# Patient Record
Sex: Female | Born: 2000 | Race: White | Hispanic: Yes | Marital: Single | Smoking: Never smoker
Health system: Southern US, Community
[De-identification: ages and names within clinical notes are randomized; demographics above are authoritative.]

---
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
## 2021-09-27 ENCOUNTER — Emergency Department (HOSPITAL_COMMUNITY)
Admission: EM | Admit: 2021-09-27 | Discharge: 2021-09-28 | Disposition: A | Discharge status: Home / Self Care | Payer: Medicaid Other | Attending: Emergency Medicine | Admitting: Emergency Medicine

## 2021-09-27 ENCOUNTER — Encounter (HOSPITAL_COMMUNITY): Payer: Self-pay

## 2021-09-27 DIAGNOSIS — L509 Urticaria, unspecified: Secondary | ICD-10-CM | POA: Insufficient documentation

## 2021-09-27 MED ORDER — PREDNISONE 20 MG PO TABS
60.0000 mg | ORAL_TABLET | Freq: Once | ORAL | Status: AC
  Administered 2021-09-28: 60 mg via ORAL
  Filled 2021-09-27: qty 3

## 2021-09-27 MED ORDER — FAMOTIDINE 20 MG PO TABS
20.0000 mg | ORAL_TABLET | Freq: Once | ORAL | Status: AC
  Administered 2021-09-28: 20 mg via ORAL
  Filled 2021-09-27: qty 1

## 2021-09-27 MED ORDER — DIPHENHYDRAMINE HCL 25 MG PO CAPS
25.0000 mg | ORAL_CAPSULE | Freq: Once | ORAL | Status: AC
  Administered 2021-09-28: 25 mg via ORAL
  Filled 2021-09-27: qty 1

## 2021-09-27 NOTE — ED Triage Notes (Signed)
Pt states that she broke out in hives several hours ago, denies SOB, denies new foods or soaps. No meds PTA

## 2021-09-27 NOTE — ED Provider Notes (Signed)
[facility] EMERGENCY DEPARTMENT Provider Note   CSN: [id number] Arrival date & time: 09/27/21  2225     History  Chief Complaint  Patient presents with   Urticaria    Laurie Adams is a 21 y.o. female.  The history is provided by the patient and medical records.  Urticaria   21 year old female presenting to the ED with hives.  States began a few hours ago, unclear etiology.  She denies any changes in soaps, detergents, medications, or new foods.  She has no known allergies.  States she feels very itchy.  No intervention tried prior to arrival.  Home Medications Prior to Admission medications   Medication Sig Start Date End Date Taking? Authorizing Provider  diphenhydrAMINE (BENADRYL) 25 MG tablet Take 1 tablet (25 mg total) by mouth every 6 (six) hours as needed. 09/28/21  Yes Garlon Hatchet, PA-C  famotidine (PEPCID) 20 MG tablet Take 1 tablet (20 mg total) by mouth daily. 09/28/21  Yes Garlon Hatchet, PA-C  predniSONE (DELTASONE) 20 MG tablet Take 40 mg by mouth daily for 3 days, then 20mg  by mouth daily for 3 days, then 10mg  daily for 3 days 09/28/21  Yes , PA-C  Cetirizine HCl 10 MG CAPS Take 1 capsule (10 mg total) by mouth every morning. 03/26/15 04/29/15  03/28/15, DO  fluticasone (FLONASE) 50 MCG/ACT nasal spray Place 2 sprays into both nostrils daily. 03/26/15 04/29/15  03/28/15, DO      Allergies    Patient has no known allergies.    Review of Systems   Review of Systems  Skin:  Positive for rash.  All other systems reviewed and are negative.   Physical Exam Updated Vital Signs BP (!) 117/98   Pulse 93   Temp 97.7 F (36.5 C) (Oral)   Resp 18   SpO2 100%   Physical Exam Vitals and nursing note reviewed.  Constitutional:      Appearance: She is well-developed.  HENT:     Head: Normocephalic and atraumatic.     Mouth/Throat:     Comments: No lip/tongue swelling, handling secretions well, no stridor Eyes:      Conjunctiva/sclera: Conjunctivae normal.     Pupils: Pupils are equal, round, and reactive to light.  Cardiovascular:     Rate and Rhythm: Normal rate and regular rhythm.     Heart sounds: Normal heart sounds.  Pulmonary:     Effort: Pulmonary effort is normal. No respiratory distress.     Breath sounds: Normal breath sounds. No rhonchi.  Abdominal:     General: Bowel sounds are normal.     Palpations: Abdomen is soft.     Tenderness: There is no abdominal tenderness. There is no rebound.  Musculoskeletal:        General: Normal range of motion.     Cervical back: Normal range of motion.  Skin:    General: Skin is warm and dry.     Findings: Rash present. Rash is urticarial.     Comments: Urticarial rash concentrated along UE and torso, sparse on LE, spares palms/soles, no signs of superimposed infection or cellulitis  Neurological:     Mental Status: She is alert and oriented to person, place, and time.     ED Results / Procedures / Treatments   Labs (all labs ordered are listed, but only abnormal results are displayed) Labs Reviewed - No data to display  EKG None  Radiology No results found.  Procedures  Procedures    Medications Ordered in ED Medications  predniSONE (DELTASONE) tablet 60 mg (60 mg Oral Given 09/28/21 0003)  diphenhydrAMINE (BENADRYL) capsule 25 mg (25 mg Oral Given 09/28/21 0003)  famotidine (PEPCID) tablet 20 mg (20 mg Oral Given 09/28/21 0003)    ED Course/ Medical Decision Making/ A&P                           Medical Decision Making Risk OTC drugs. Prescription drug management.   21 year old female here with urticaria.  Unclear etiology, ongoing for few hours now.  Afebrile, nontoxic.  She has no lip or tongue swelling, handling secretions well, no stridor.  Mild urticarial rash, concentrated along upper extremities and torso, sparingly on lower extremities.  Rash spares palms and soles.  No signs of superimposed infection or cellulitis.   Treated here with prednisone, Benadryl, Pepcid, plan to discharge home with same.  Does not currently have a PCP, given follow-up with wellness clinic.  Can return here for new concerns.  Final Clinical Impression(s) / ED Diagnoses Final diagnoses:  Urticaria    Rx / DC Orders ED Discharge Orders          Ordered    predniSONE (DELTASONE) 20 MG tablet        09/28/21 0005    famotidine (PEPCID) 20 MG tablet  Daily        09/28/21 0005    diphenhydrAMINE (BENADRYL) 25 MG tablet  Every 6 hours PRN        09/28/21 0005              Garlon Hatchet, PA-C 09/28/21 0011    Sabas Sous, MD 09/28/21 [phone]

## 2021-09-28 MED ORDER — FAMOTIDINE 20 MG PO TABS: 20.0000 mg | ORAL_TABLET | Freq: Every day | ORAL | 0 refills | Status: DC

## 2021-09-28 MED ORDER — DIPHENHYDRAMINE HCL 25 MG PO TABS: 25.0000 mg | ORAL_TABLET | Freq: Four times a day (QID) | ORAL | 0 refills | Status: AC | PRN

## 2021-09-28 MED ORDER — PREDNISONE 20 MG PO TABS: ORAL_TABLET | ORAL | 0 refills | Status: DC

## 2021-09-28 NOTE — Discharge Instructions (Addendum)
Take the prescribed medication as directed. Follow-up with wellness clinic.   Call for appt. Return to the ED for new or worsening symptoms.

## 2021-09-28 NOTE — ED Notes (Signed)
Patient verbalizes understanding of discharge instructions. Opportunity for questioning and answers were provided. Armband removed by staff, pt discharged from ED. Pt ambulatory to ED waiting room. 

## 2022-12-09 LAB — OB RESULTS CONSOLE GC/CHLAMYDIA: Chlamydia: POSITIVE

## 2023-01-06 LAB — OB RESULTS CONSOLE RUBELLA ANTIBODY, IGM: Rubella: IMMUNE

## 2023-01-06 LAB — OB RESULTS CONSOLE RPR: RPR: NONREACTIVE

## 2023-01-06 LAB — OB RESULTS CONSOLE HIV ANTIBODY (ROUTINE TESTING): HIV: NONREACTIVE

## 2023-01-06 LAB — OB RESULTS CONSOLE ANTIBODY SCREEN: Antibody Screen: NEGATIVE

## 2023-01-06 LAB — HEPATITIS C ANTIBODY: HCV Ab: NEGATIVE

## 2023-01-06 LAB — OB RESULTS CONSOLE HEPATITIS B SURFACE ANTIGEN: Hepatitis B Surface Ag: NEGATIVE

## 2023-01-06 LAB — OB RESULTS CONSOLE GC/CHLAMYDIA
Chlamydia: NEGATIVE
Neisseria Gonorrhea: NEGATIVE

## 2023-03-30 NOTE — L&D Delivery Note (Signed)
 Delivery Note Patient pushed well for 1 hour.  At 5:13 PM a viable female was delivered via Vaginal, Spontaneous (Presentation: Left Occiput Anterior).  APGAR: 8, 9; weight 8 lb 8.2 oz (3860 gm) .   Placenta status: Spontaneous, Intact.  Cord: 3 vessels with the following complications: None.  Cord pH: n/a  Anesthesia: Epidural Episiotomy:  No Lacerations: 2nd degree;Perineal Suture Repair: 2.0 vicryl rapide Est. Blood Loss (mL):  431 mL  Mom to postpartum.  Baby to Couplet care / Skin to Skin.  [facility] GEFFEL Neasia Fleeman 07/18/2023, 5:36 PM

## 2023-07-01 LAB — OB RESULTS CONSOLE GBS: GBS: NEGATIVE

## 2023-07-07 ENCOUNTER — Telehealth (HOSPITAL_COMMUNITY): Payer: Self-pay | Admitting: *Deleted

## 2023-07-07 NOTE — Telephone Encounter (Signed)
 Preadmission screen

## 2023-07-12 ENCOUNTER — Telehealth (HOSPITAL_COMMUNITY): Payer: Self-pay | Admitting: *Deleted

## 2023-07-12 NOTE — Telephone Encounter (Signed)
 Preadmission screen

## 2023-07-13 ENCOUNTER — Telehealth (HOSPITAL_COMMUNITY): Payer: Self-pay | Admitting: *Deleted

## 2023-07-13 ENCOUNTER — Encounter (HOSPITAL_COMMUNITY): Payer: Self-pay | Admitting: *Deleted

## 2023-07-13 NOTE — Telephone Encounter (Signed)
 Preadmission screen

## 2023-07-18 ENCOUNTER — Inpatient Hospital Stay (HOSPITAL_COMMUNITY): Admitting: Anesthesiology

## 2023-07-18 ENCOUNTER — Inpatient Hospital Stay (HOSPITAL_COMMUNITY)
Admission: AD | Admit: 2023-07-18 | Discharge: 2023-07-20 | DRG: 806 | Disposition: A | Discharge status: Home / Self Care | Attending: Obstetrics | Admitting: Obstetrics

## 2023-07-18 ENCOUNTER — Encounter (HOSPITAL_COMMUNITY): Payer: Self-pay | Admitting: Obstetrics and Gynecology

## 2023-07-18 ENCOUNTER — Other Ambulatory Visit: Payer: Self-pay

## 2023-07-18 DIAGNOSIS — L299 Pruritus, unspecified: Secondary | ICD-10-CM | POA: Diagnosis not present

## 2023-07-18 DIAGNOSIS — O99214 Obesity complicating childbirth: Secondary | ICD-10-CM | POA: Diagnosis present

## 2023-07-18 DIAGNOSIS — O480 Post-term pregnancy: Principal | ICD-10-CM | POA: Diagnosis present

## 2023-07-18 DIAGNOSIS — O99892 Other specified diseases and conditions complicating childbirth: Secondary | ICD-10-CM | POA: Diagnosis not present

## 2023-07-18 DIAGNOSIS — Z3A40 40 weeks gestation of pregnancy: Secondary | ICD-10-CM | POA: Diagnosis not present

## 2023-07-18 DIAGNOSIS — K219 Gastro-esophageal reflux disease without esophagitis: Secondary | ICD-10-CM | POA: Diagnosis present

## 2023-07-18 DIAGNOSIS — O9962 Diseases of the digestive system complicating childbirth: Secondary | ICD-10-CM | POA: Diagnosis present

## 2023-07-18 DIAGNOSIS — O26893 Other specified pregnancy related conditions, third trimester: Secondary | ICD-10-CM | POA: Diagnosis present

## 2023-07-18 DIAGNOSIS — D62 Acute posthemorrhagic anemia: Secondary | ICD-10-CM | POA: Diagnosis not present

## 2023-07-18 DIAGNOSIS — O9081 Anemia of the puerperium: Secondary | ICD-10-CM | POA: Diagnosis not present

## 2023-07-18 LAB — CBC
HCT: 34 % — ABNORMAL LOW (ref 36.0–46.0)
Hemoglobin: 10.4 g/dL — ABNORMAL LOW (ref 12.0–15.0)
MCH: 24.2 pg — ABNORMAL LOW (ref 26.0–34.0)
MCHC: 30.6 g/dL (ref 30.0–36.0)
MCV: 79.3 fL — ABNORMAL LOW (ref 80.0–100.0)
Platelets: 190 10*3/uL (ref 150–400)
RBC: 4.29 MIL/uL (ref 3.87–5.11)
RDW: 17.3 % — ABNORMAL HIGH (ref 11.5–15.5)
WBC: 12.1 10*3/uL — ABNORMAL HIGH (ref 4.0–10.5)
nRBC: 0 % (ref 0.0–0.2)

## 2023-07-18 LAB — RPR: RPR Ser Ql: NONREACTIVE

## 2023-07-18 LAB — TYPE AND SCREEN
ABO/RH(D): O POS
Antibody Screen: NEGATIVE

## 2023-07-18 MED ORDER — LACTATED RINGERS IV SOLN
INTRAVENOUS | Status: DC
Start: 2023-07-18 — End: 2023-07-18

## 2023-07-18 MED ORDER — LACTATED RINGERS IV SOLN: 500.0000 mL | Freq: Once | INTRAVENOUS | Status: DC

## 2023-07-18 MED ORDER — IBUPROFEN 600 MG PO TABS
600.0000 mg | ORAL_TABLET | Freq: Four times a day (QID) | ORAL | Status: DC
  Administered 2023-07-18 – 2023-07-20 (×5): 600 mg via ORAL
  Filled 2023-07-18 (×6): qty 1

## 2023-07-18 MED ORDER — ONDANSETRON HCL 4 MG/2ML IJ SOLN: 4.0000 mg | INTRAMUSCULAR | Status: DC | PRN

## 2023-07-18 MED ORDER — PHENYLEPHRINE 80 MCG/ML (10ML) SYRINGE FOR IV PUSH (FOR BLOOD PRESSURE SUPPORT)
80.0000 ug | PREFILLED_SYRINGE | INTRAVENOUS | Status: DC | PRN
  Filled 2023-07-18: qty 10

## 2023-07-18 MED ORDER — ACETAMINOPHEN 325 MG PO TABS: 650.0000 mg | ORAL_TABLET | ORAL | Status: DC | PRN

## 2023-07-18 MED ORDER — BENZOCAINE-MENTHOL 20-0.5 % EX AERO
1.0000 | INHALATION_SPRAY | CUTANEOUS | Status: DC | PRN
  Filled 2023-07-18: qty 56

## 2023-07-18 MED ORDER — OXYTOCIN BOLUS FROM INFUSION
333.0000 mL | Freq: Once | INTRAVENOUS | Status: AC
  Administered 2023-07-18: 333 mL via INTRAVENOUS

## 2023-07-18 MED ORDER — OXYTOCIN-SODIUM CHLORIDE 30-0.9 UT/500ML-% IV SOLN
1.0000 m[IU]/min | INTRAVENOUS | Status: DC
Start: 2023-07-18 — End: 2023-07-18

## 2023-07-18 MED ORDER — ONDANSETRON HCL 4 MG/2ML IJ SOLN: 4.0000 mg | Freq: Four times a day (QID) | INTRAMUSCULAR | Status: DC | PRN

## 2023-07-18 MED ORDER — FENTANYL-BUPIVACAINE-NACL 0.5-0.125-0.9 MG/250ML-% EP SOLN
12.0000 mL/h | EPIDURAL | Status: DC | PRN
  Administered 2023-07-18: 12 mL/h via EPIDURAL
  Filled 2023-07-18: qty 250

## 2023-07-18 MED ORDER — OXYCODONE-ACETAMINOPHEN 5-325 MG PO TABS: 1.0000 | ORAL_TABLET | ORAL | Status: DC | PRN

## 2023-07-18 MED ORDER — SENNOSIDES-DOCUSATE SODIUM 8.6-50 MG PO TABS
2.0000 | ORAL_TABLET | ORAL | Status: DC
  Administered 2023-07-19 – 2023-07-20 (×2): 2 via ORAL
  Filled 2023-07-18 (×2): qty 2

## 2023-07-18 MED ORDER — SIMETHICONE 80 MG PO CHEW: 80.0000 mg | CHEWABLE_TABLET | ORAL | Status: DC | PRN

## 2023-07-18 MED ORDER — SOD CITRATE-CITRIC ACID 500-334 MG/5ML PO SOLN: 30.0000 mL | ORAL | Status: DC | PRN

## 2023-07-18 MED ORDER — EPHEDRINE 5 MG/ML INJ: 10.0000 mg | INTRAVENOUS | Status: DC | PRN

## 2023-07-18 MED ORDER — OXYCODONE-ACETAMINOPHEN 5-325 MG PO TABS: 2.0000 | ORAL_TABLET | ORAL | Status: DC | PRN

## 2023-07-18 MED ORDER — PHENYLEPHRINE 80 MCG/ML (10ML) SYRINGE FOR IV PUSH (FOR BLOOD PRESSURE SUPPORT): 80.0000 ug | PREFILLED_SYRINGE | INTRAVENOUS | Status: DC | PRN

## 2023-07-18 MED ORDER — DIPHENHYDRAMINE HCL 50 MG/ML IJ SOLN: 12.5000 mg | INTRAMUSCULAR | Status: DC | PRN

## 2023-07-18 MED ORDER — COCONUT OIL OIL: 1.0000 | TOPICAL_OIL | Status: DC | PRN

## 2023-07-18 MED ORDER — OXYTOCIN-SODIUM CHLORIDE 30-0.9 UT/500ML-% IV SOLN
1.0000 m[IU]/min | INTRAVENOUS | Status: DC
  Administered 2023-07-18: 2 m[IU]/min via INTRAVENOUS
  Filled 2023-07-18: qty 500

## 2023-07-18 MED ORDER — DIPHENHYDRAMINE HCL 25 MG PO CAPS: 25.0000 mg | ORAL_CAPSULE | Freq: Four times a day (QID) | ORAL | Status: DC | PRN

## 2023-07-18 MED ORDER — FENTANYL CITRATE (PF) 100 MCG/2ML IJ SOLN
50.0000 ug | INTRAMUSCULAR | Status: DC | PRN
Start: 2023-07-18 — End: 2023-07-18

## 2023-07-18 MED ORDER — ONDANSETRON HCL 4 MG PO TABS: 4.0000 mg | ORAL_TABLET | ORAL | Status: DC | PRN

## 2023-07-18 MED ORDER — TERBUTALINE SULFATE 1 MG/ML IJ SOLN: 0.2500 mg | Freq: Once | INTRAMUSCULAR | Status: DC | PRN

## 2023-07-18 MED ORDER — LACTATED RINGERS IV SOLN: 500.0000 mL | INTRAVENOUS | Status: DC | PRN

## 2023-07-18 MED ORDER — PRENATAL MULTIVITAMIN CH
1.0000 | ORAL_TABLET | Freq: Every day | ORAL | Status: DC
  Administered 2023-07-19: 1 via ORAL
  Filled 2023-07-18: qty 1

## 2023-07-18 MED ORDER — OXYTOCIN-SODIUM CHLORIDE 30-0.9 UT/500ML-% IV SOLN
2.5000 [IU]/h | INTRAVENOUS | Status: DC
  Administered 2023-07-18: 2.5 [IU]/h via INTRAVENOUS

## 2023-07-18 MED ORDER — WITCH HAZEL-GLYCERIN EX PADS: 1.0000 | MEDICATED_PAD | CUTANEOUS | Status: DC | PRN

## 2023-07-18 MED ORDER — OXYCODONE HCL 5 MG PO TABS: 10.0000 mg | ORAL_TABLET | ORAL | Status: DC | PRN

## 2023-07-18 MED ORDER — TETANUS-DIPHTH-ACELL PERTUSSIS 5-2.5-18.5 LF-MCG/0.5 IM SUSY: 0.5000 mL | PREFILLED_SYRINGE | Freq: Once | INTRAMUSCULAR | Status: DC

## 2023-07-18 MED ORDER — LIDOCAINE HCL (PF) 1 % IJ SOLN: 30.0000 mL | INTRAMUSCULAR | Status: DC | PRN

## 2023-07-18 MED ORDER — LIDOCAINE HCL (PF) 1 % IJ SOLN
INTRAMUSCULAR | Status: DC | PRN
  Administered 2023-07-18 (×2): 5 mL via EPIDURAL

## 2023-07-18 MED ORDER — DIBUCAINE (PERIANAL) 1 % EX OINT: 1.0000 | TOPICAL_OINTMENT | CUTANEOUS | Status: DC | PRN

## 2023-07-18 MED ORDER — OXYCODONE HCL 5 MG PO TABS: 5.0000 mg | ORAL_TABLET | ORAL | Status: DC | PRN

## 2023-07-18 NOTE — H&P (Signed)
 23 y.o. G1P0 @ [redacted]w[redacted]d presents with contractions q5-8 minutes and possible LOF.  Per MAU report (though not documented in EPIC) fern was negative.  She was noted to have a small amount of vaginal bleeding, so she was kept for labor augmentation.  Otherwise has good fetal movement.  Pregnancy complicated by:  History of chlamydia in September 2024.  F/u testing in 12/2022 and 06/2023 was negative  History reviewed. No pertinent past medical history. History reviewed. No pertinent surgical history.  OB History  Gravida Para Term Preterm AB Living  1       SAB IAB Ectopic Multiple Live Births          # Outcome Date GA Lbr Len/2nd Weight Sex Type Anes PTL Lv  1 Current             Social History   Socioeconomic History   Marital status: Single    Spouse name: Not on file   Number of children: Not on file   Years of education: Not on file   Highest education level: Not on file  Occupational History   Not on file  Tobacco Use   Smoking status: Never   Smokeless tobacco: Never  Vaping Use   Vaping status: Never Used  Substance and Sexual Activity   Alcohol use: Not Currently   Drug use: Never   Sexual activity: Not Currently  Other Topics Concern   Not on file  Social History Narrative   Not on file   Social Drivers of Health   Financial Resource Strain: Not on file  Food Insecurity: Not on file  Transportation Needs: Not on file  Physical Activity: Not on file  Stress: Not on file  Social Connections: Not on file  Intimate Partner Violence: Not on file   Patient has no known allergies.    Prenatal Transfer Tool  Maternal Diabetes: No Genetic Screening: Normal Maternal Ultrasounds/Referrals: Normal Fetal Ultrasounds or other Referrals:  None Maternal Substance Abuse:  No Significant Maternal Medications:  None Significant Maternal Lab Results: Group B Strep negative Vaccines: flu and tdap  ABO, Rh: --/--/O POS (04/21 0631) Antibody: NEG (04/21 0631) Rubella:  Immune (10/10 0000) RPR: Nonreactive (10/10 0000)  HBsAg: Negative (10/10 0000)  HIV: Non-reactive (10/10 0000)  GBS: Negative/-- (04/04 0000)     Vitals:   07/18/23 0753 07/18/23 0836  BP: 132/78 125/84  Pulse: 87 96  Resp: 16   Temp: 98.1 F (36.7 C)   SpO2:       General:  NAD Abdomen:  soft, gravid, EFW 7.5# Ex:  trace edema SVE:  3/60/-3 per MAU.  On pad, small amount of old heme FHTs:  150s, moderate variability, + accelerations, category 1 Toco:  q5-7 minutes    A/P   23 y.o. G1P0 [redacted]w[redacted]d presents with latent labor and vaginal bleeding at term Admit to L&D Vaginal bleeding: may be early bloody show.  Will monitor closely.  Hemoglobin on admission 10.4 Latent labor: patient amenable to augmentation with pitocin .  Will start and AROM prn GBS negative Anticipate SVD    Karan Inclan GEFFEL Ereka Brau

## 2023-07-18 NOTE — MAU Note (Signed)
 Laurie Adams is a 23 y.o. at [redacted]w[redacted]d here in MAU reporting: ctx that started at 0430. Pt states they are irregular anywhere from 5-8 minutes. Pt states she thinks her water broke around 0430. Pt states when she looked at the fluid it was a small spot of watery blood. Pt states she saw a small spot of blood when she wiped but nothing since. +FM   SVE Wednesday 3cm  Onset of complaint: 0430 Pain score: 4/10 lower back  Vitals:   07/18/23 0529  BP: 127/82  Pulse: 95  Resp: 16  Temp: 98 F (36.7 C)  SpO2: 97%     FHT: 165 Lab orders placed from triage:  mau labor

## 2023-07-18 NOTE — Anesthesia Procedure Notes (Signed)
 Epidural Patient location during procedure: OB Start time: 07/18/2023 12:42 PM End time: 07/18/2023 12:51 PM  Staffing Anesthesiologist: Tura Gaines, MD Performed: anesthesiologist   Preanesthetic Checklist Completed: patient identified, IV checked, site marked, risks and benefits discussed, surgical consent, monitors and equipment checked, pre-op evaluation and timeout performed  Epidural Patient position: sitting Prep: DuraPrep and site prepped and draped Patient monitoring: continuous pulse ox and blood pressure Approach: midline Location: L3-L4 Injection technique: LOR air  Needle:  Needle type: Tuohy  Needle gauge: 17 G Needle length: 9 cm and 9 Needle insertion depth: 7 cm Catheter type: closed end flexible Catheter size: 19 Gauge Catheter at skin depth: 12 cm Test dose: negative and Other  Assessment Events: blood not aspirated, no cerebrospinal fluid, injection not painful, no injection resistance, no paresthesia and negative IV test  Additional Notes  Patient identified. Risks and benefits discussed including failed block, incomplete  Pain control, post dural puncture headache, nerve damage, paralysis, blood pressure Changes, nausea, vomiting, reactions to medications-both toxic and allergic and post Partum back pain. All questions were answered. Patient expressed understanding and wished to proceed. Sterile technique was used throughout procedure. Epidural site was Dressed with sterile barrier dressing. No paresthesias, signs of intravascular injection Or signs of intrathecal spread were encountered. Attempt x 2. Technically difficult due to poor positioning. Patient was more comfortable after the epidural was dosed. Please see RN's note for documentation of vital signs and FHR which are stable. Reason for block:procedure for pain

## 2023-07-18 NOTE — Anesthesia Preprocedure Evaluation (Signed)
 Anesthesia Evaluation  Patient identified by MRN, date of birth, ID band Patient awake    Reviewed: Allergy & Precautions, Patient's Chart, lab work & pertinent test results  Airway Mallampati: II  TM Distance: >3 FB     Dental no notable dental hx. (+) Dental Advisory Given   Pulmonary neg pulmonary ROS   Pulmonary exam normal        Cardiovascular negative cardio ROS Normal cardiovascular exam Rhythm:Regular Rate:Normal     Neuro/Psych negative neurological ROS  negative psych ROS   GI/Hepatic Neg liver ROS,GERD  ,,  Endo/Other  Obesity  Renal/GU negative Renal ROS  negative genitourinary   Musculoskeletal negative musculoskeletal ROS (+)    Abdominal  (+) + obese  Peds  Hematology  (+) Blood dyscrasia, anemia   Anesthesia Other Findings   Reproductive/Obstetrics (+) Pregnancy                              Anesthesia Physical Anesthesia Plan  ASA: 2  Anesthesia Plan: Epidural   Post-op Pain Management:    Induction: Intravenous  PONV Risk Score and Plan: Treatment may vary due to age or medical condition  Airway Management Planned: Natural Airway  Additional Equipment: Fetal Monitoring and None  Intra-op Plan:   Post-operative Plan:   Informed Consent: I have reviewed the patients History and Physical, chart, labs and discussed the procedure including the risks, benefits and alternatives for the proposed anesthesia with the patient or authorized representative who has indicated his/her understanding and acceptance.       Plan Discussed with: Anesthesiologist  Anesthesia Plan Comments:          Anesthesia Quick Evaluation

## 2023-07-18 NOTE — Progress Notes (Signed)
 In to see patient at 68 Per RN, had position change from throne to left lateral resulting in prolonged deceleration with resolution when changed to right lateral position  BP 118/78   Pulse 90   Temp 98.8 F (37.1 C) (Oral)   Resp 16   Ht 5\' 3"  (1.6 m)   Wt 79.5 kg   SpO2 99%   BMI 31.05 kg/m   Toco:q3 minutes EFM: Following above deceleration, now 150 with moderate variability and accelerations SVE: 6-7/90/-1  G1 @ [redacted]w[redacted]d with labor Labor: now in active labor.  Continue pitocin  augmentation.  Anticipate SVD

## 2023-07-18 NOTE — Lactation Note (Signed)
 This note was copied from a baby's chart. Lactation Consultation Note  Patient Name: Boy Dynesha Woolen [id number] Date: 07/18/2023 Age:23 hours Reason for consult: Initial assessment;Primapara;1st time breastfeeding;Term;Breastfeeding assistance  P1- MOB reports that she attempted a latch after birth, but infant seemed frustrated so they gave him formula. It had been 3 hrs since infant's last feeding, so LC offered to assist with a latch. MOB in agreement. LC placed infant on the left breast in the cross cradle hold (per request). After a few tries, infant was able to latch and had a strong rhythmic suck. MOB denied experiencing any pain or pinching. Due to MOB's positioning, infant had difficulty sustaining latch without someone holding the breast. Infant nursed on and off for 8 minutes before MOB decided to offer formula due to infant becoming frustrated. LC reviewed how to pace feed infant with FOB. MOB plans to offer both breast and formula. LC encouraged always offering the breast first with feedings to protect MOB's supply.  LC reviewed feeding infant on cue 8-12x in 24 hrs, not allowing infant to go over 3 hrs without a feeding, the first 24 hr birthday nap, day 2 cluster feeding, CDC milk storage guidelines, LC services handout and engorgement/breast care. LC encouraged MOB to call for further assistance as needed.  Maternal Data Has patient been taught Hand Expression?: Yes Does the patient have breastfeeding experience prior to this delivery?: No  Feeding Mother's Current Feeding Choice: Breast Milk and Formula Nipple Type: Slow - flow  LATCH Score Latch: Repeated attempts needed to sustain latch, nipple held in mouth throughout feeding, stimulation needed to elicit sucking reflex.  Audible Swallowing: None  Type of Nipple: Everted at rest and after stimulation  Comfort (Breast/Nipple): Soft / non-tender  Hold (Positioning): Full assist, staff holds infant at breast  LATCH  Score: 5   Lactation Tools Discussed/Used Pump Education: Milk Storage  Interventions Interventions: Breast feeding basics reviewed;Assisted with latch;Hand express;Breast compression;Adjust position;Support pillows;Position options;Education;LC Services brochure  Discharge Discharge Education: Engorgement and breast care;Warning signs for feeding baby Pump: DEBP;Hands Free;Personal  Consult Status Consult Status: Follow-up Date: 07/19/23 Follow-up type: In-patient    Vernette Goo BS, IBCLC 07/18/2023, 10:28 PM

## 2023-07-18 NOTE — Progress Notes (Signed)
 Contractions becoming more painful.  Breathing well through contractions  BP 118/73   Pulse 91   Temp 98.8 F (37.1 C) (Oral)   Resp 16   Ht 5\' 3"  (1.6 m)   Wt 79.5 kg   SpO2 99%   BMI 31.05 kg/m   Toco: q2-3 minutes EFM: 150s, moderate variability, + accelerations, category 1  SVE: 5/90/-2, AROM scant clear fluid.  Minimal blood on pad or glove  A/P: G1 @ [redacted]w[redacted]d with labor Labor: entering active labor.  Continue pitocin  augmentation.  Pain management upon patient request Anticipate SVD

## 2023-07-19 ENCOUNTER — Encounter (HOSPITAL_COMMUNITY): Payer: Self-pay | Admitting: Obstetrics

## 2023-07-19 LAB — CBC
HCT: 26.5 % — ABNORMAL LOW (ref 36.0–46.0)
Hemoglobin: 8.1 g/dL — ABNORMAL LOW (ref 12.0–15.0)
MCH: 24.1 pg — ABNORMAL LOW (ref 26.0–34.0)
MCHC: 30.6 g/dL (ref 30.0–36.0)
MCV: 78.9 fL — ABNORMAL LOW (ref 80.0–100.0)
Platelets: 147 10*3/uL — ABNORMAL LOW (ref 150–400)
RBC: 3.36 MIL/uL — ABNORMAL LOW (ref 3.87–5.11)
RDW: 17.5 % — ABNORMAL HIGH (ref 11.5–15.5)
WBC: 14.2 10*3/uL — ABNORMAL HIGH (ref 4.0–10.5)
nRBC: 0 % (ref 0.0–0.2)

## 2023-07-19 MED ORDER — IBUPROFEN 600 MG PO TABS: 600.0000 mg | ORAL_TABLET | Freq: Four times a day (QID) | ORAL | 1 refills | Status: AC | PRN

## 2023-07-19 NOTE — Discharge Summary (Signed)
 Postpartum Discharge Summary  Date of Service updated      Patient Name: Laurie Adams DOB: 02/22/2001 MRN: [id number]  Date of admission: 07/18/2023 Delivery date:07/18/2023 Delivering provider: Luan Rumpf Date of discharge: 07/19/2023  Admitting diagnosis: Post term pregnancy over 40 weeks [O48.0] Intrauterine pregnancy: [redacted]w[redacted]d     Secondary diagnosis:  Principal Problem:   Post term pregnancy over 40 weeks  Additional problems: anemia of acute blood loss with no clinical significance    Discharge diagnosis: Term Pregnancy Delivered                                              Post partum procedures: n/a Augmentation: AROM and Pitocin  Complications: None  Hospital course: Induction of Labor With Vaginal Delivery   23 y.o. yo G1P1001 at [redacted]w[redacted]d was admitted to the hospital 07/18/2023 for induction of labor.  Indication for induction: Favorable cervix at term.  Patient had an labor course complicated by n/a Membrane Rupture Time/Date: 11:38 AM,07/18/2023  Delivery Method:Vaginal, Spontaneous Operative Delivery:N/A Episiotomy:   Lacerations:  2nd degree;Perineal Details of delivery can be found in separate delivery note.  Patient had a postpartum course complicated by anemia of acute blood loss. Patient is discharged home 07/19/23.  Newborn Data: Birth date:07/18/2023 Birth time:5:13 PM Gender:Female Living status:Living Apgars:8 ,9  Weight:3860 g  Magnesium Sulfate received: No BMZ received: No Rhophylac:N/A MMR:N/A T-DaP:Given prenatally Transfusion:No Immunizations administered: There is no immunization history for the selected administration types on file for this patient.  Physical exam  Vitals:   07/18/23 2350 07/19/23 0135 07/19/23 0530 07/19/23 0906  BP:  103/71 110/76 99/60  Pulse:  99 100 90  Resp:  17 18 16   Temp: 98.9 F (37.2 C) 98.4 F (36.9 C) 97.8 F (36.6 C) 98 F (36.7 C)  TempSrc: Oral Oral Oral Oral  SpO2:  99% 100% 100%  Weight:       Height:       General: alert, cooperative, and no distress Lochia: appropriate Uterine Fundus: firm Incision: N/A,  DVT Evaluation: No evidence of DVT seen on physical exam. Labs: Lab Results  Component Value Date   WBC 14.2 (H) 07/19/2023   HGB 8.1 (L) 07/19/2023   HCT 26.5 (L) 07/19/2023   MCV 78.9 (L) 07/19/2023   PLT 147 (L) 07/19/2023      Latest Ref Rng & Units 04/09/2010    8:40 AM  CMP  Glucose 70 - 99 mg/dL 89   BUN 6 - 23 mg/dL 15   Creatinine 0.4 - 1.2 mg/dL 0.7   Sodium 295 - 621 mEq/L 137   Potassium 3.5 - 5.1 mEq/L 3.9   Chloride 96 - 112 mEq/L 102    Edinburgh Score:     No data to display            After visit meds:  Allergies as of 07/19/2023   No Known Allergies      Medication List     TAKE these medications    Cetirizine  HCl 10 MG Caps Take 1 capsule (10 mg total) by mouth every morning.   diphenhydrAMINE  25 MG tablet Commonly known as: BENADRYL  Take 1 tablet (25 mg total) by mouth every 6 (six) hours as needed.   famotidine  20 MG tablet Commonly known as: Pepcid  Take 1 tablet (20 mg total) by mouth daily.   fluticasone  50  MCG/ACT nasal spray Commonly known as: FLONASE  Place 2 sprays into both nostrils daily.   ibuprofen  600 MG tablet Commonly known as: ADVIL  Take 1 tablet (600 mg total) by mouth every 6 (six) hours as needed.   predniSONE  20 MG tablet Commonly known as: DELTASONE  Take 40 mg by mouth daily for 3 days, then 20mg  by mouth daily for 3 days, then 10mg  daily for 3 days   prenatal multivitamin Tabs tablet Take 1 tablet by mouth daily at 12 noon.         Discharge home in stable condition Infant Feeding: Breast Infant Disposition:home with mother Discharge instruction: per After Visit Summary and Postpartum booklet. Activity: Advance as tolerated. Pelvic rest for 6 weeks.  Diet: iron rich diet Anticipated Birth Control: Unsure Postpartum Appointment:6 weeks Additional Postpartum F/U: Postpartum  Depression checkup Future Appointments:No future appointments. Follow up Visit:  Follow-up Information     Ob/Gyn, Tita Form. Schedule an appointment as soon as possible for a visit in 6 week(s).   Why: For postpartum visit Contact information: [address] [place] [address] [phone]                     07/19/2023 Levis Reams, DO

## 2023-07-19 NOTE — Lactation Note (Signed)
 This note was copied from a baby's chart. Lactation Consultation Note  Patient Name: Laurie Adams [id number] Date: 07/19/2023 Age:23 hours Reason for consult: Follow-up assessment;Primapara;1st time breastfeeding;Term  P1 - consulted for follow up consultation. Pt expressed that she is primarily bottle feeding formula while working on milk production. This LC asked if pt would like a manual pump to stimulate the breasts when not feeding directly, pt agreed. Taught how to use manual with size 18mm flanges. Pt expressed several drops of colostrum. Left at bedside to offer to baby at next feeding. Recommended using the manual pump when not nursing or to provide expressed breast milk to baby via bottle.   Educated on milk storage, cleaning of pump parts, etc. Left hand outs at the bedside. Pt to call for additional support with lactation as needed.   Maternal Data    Feeding Mother's Current Feeding Choice: Breast Milk and Formula Nipple Type: Slow - flow  LATCH Score   Pt reports infant is being supplemented mostly with formula but working on milk production.  Lactation Tools Discussed/Used Tools: Pump Breast pump type: Manual Pump Education: Setup, frequency, and cleaning;Milk Storage Reason for Pumping: supplementation Pumping frequency: when not nursing, q3h  Interventions Interventions: Breast feeding basics reviewed;Hand pump;Expressed milk;Education;CDC milk storage guidelines;CDC Guidelines for Breast Pump Cleaning  Discharge Pump: Hands Free;Personal;DEBP  Consult Status Consult Status: Follow-up Date: 07/20/23 Follow-up type: In-patient    Laurie Adams 07/19/2023, 4:14 PM

## 2023-07-19 NOTE — Discharge Instructions (Signed)
 Call office with any concerns [phone]

## 2023-07-19 NOTE — Anesthesia Postprocedure Evaluation (Signed)
 Anesthesia Post Note  Patient: Laurie Adams  Procedure(s) Performed: AN AD HOC LABOR EPIDURAL     Patient location during evaluation: Mother Baby Anesthesia Type: Epidural Level of consciousness: awake and alert and oriented Pain management: satisfactory to patient Vital Signs Assessment: post-procedure vital signs reviewed and stable Respiratory status: respiratory function stable Cardiovascular status: stable Postop Assessment: no headache, no backache, epidural receding, patient able to bend at knees, no signs of nausea or vomiting, adequate PO intake and able to ambulate Anesthetic complications: no   No notable events documented.  Last Vitals:  Vitals:   07/19/23 0135 07/19/23 0530  BP: 103/71 110/76  Pulse: 99 100  Resp: 17 18  Temp: 36.9 C 36.6 C  SpO2: 99% 100%    Last Pain:  Vitals:   07/19/23 0530  TempSrc: Oral  PainSc: 0-No pain   Pain Goal:                   Laurie Adams

## 2023-07-19 NOTE — Progress Notes (Addendum)
 Post Partum Day 1 Subjective: no complaints, up ad lib, voiding, tolerating PO, + flatus, and lochia mild. She denies HA, CP, SOB. She also specifically denies any fever or chills.  She is bonding well with baby - still unsure if wants circumcision. Breastfeeding well. She would like discharge to home today if baby cleared   Objective: Blood pressure 99/60, pulse 90, temperature 98 F (36.7 C), temperature source Oral, resp. rate 16, height 5\' 3"  (1.6 m), weight 79.5 kg, SpO2 100%, unknown if currently breastfeeding.  Physical Exam:  General: alert, cooperative, and no distress Lochia: appropriate Uterine Fundus: firm Incision: n/a DVT Evaluation: No evidence of DVT seen on physical exam.  Recent Labs    07/18/23 0631 07/19/23 0642  HGB 10.4* 8.1*  HCT 34.0* 26.5*    Assessment/Plan: Pt had a temp of >100.4 last night at 2100 - if no more fever and still asymptomatic later today can be discharged home if baby cleared.  Instructions reviewed Discussed indications for circumcision. Explained cosmetic procedure but if wants done ideal to do prior to discharge. Will decide.  Pt understands that neonatal circumcision is not considered medically necessary and is elective. The risks include, but are not limited to bleeding, infection, damage to the penis, development of scar tissue, and having to have it redone at a later date. Pt understands theses risks and wishes to proceed   LOS: 1 day   Laurie Adams W Laurie Cortner, DO 07/19/2023, 10:29 AM

## 2023-07-20 MED ORDER — FERROUS SULFATE 325 (65 FE) MG PO TBEC: 325.0000 mg | DELAYED_RELEASE_TABLET | ORAL | Status: AC

## 2023-07-20 MED ORDER — HYDROCORTISONE 1 % EX OINT: 1.0000 | TOPICAL_OINTMENT | Freq: Two times a day (BID) | CUTANEOUS | Status: AC

## 2023-07-20 NOTE — Progress Notes (Signed)
 Post Partum Day 2 Subjective: Pruritus of the wrists as well as lower neck. No difficulty with breathing or SOB. Lochia normal. Pain controlled. Combination feeding. Ambulating and tolerating PO.  Objective: Blood pressure 98/68, pulse 88, temperature 97.8 F (36.6 C), temperature source Oral, resp. rate 17, height 5\' 3"  (1.6 m), weight 79.5 kg, SpO2 99%, unknown if currently breastfeeding.  Physical Exam:  General: alert, cooperative, and no distress Lochia: appropriate Uterine Fundus: firm DVT Evaluation: No evidence of DVT seen on physical exam. No significant calf/ankle edema. Skin: excoriation of wrists, no hives. Right neck with few raised lesions  Recent Labs    07/18/23 0631 07/19/23 0642  HGB 10.4* 8.1*  HCT 34.0* 26.5*    Assessment/Plan: -PP: Doing well, discharge home - Anemia: Clinically significant for this hospitalization. Hb 10-->8. PO iron  - Pruritus: unknown trigger. Ok to use OTC cream - Decided against circumcision    LOS: 2 days   Leanne Pronto, DO 07/20/2023, 10:23 AM

## 2023-07-20 NOTE — Lactation Note (Addendum)
 This note was copied from a baby's chart. Lactation Consultation Note  Patient Name: Boy Kaiulani Sitton [id number] Date: 07/20/2023 Age:23 hours Reason for consult: Follow-up assessment;1st time breastfeeding;Term  P1, Baby has primarily been formula feeding. Baby sleeping.  Mother has been occasionally pumping with manual pump for stimulatin and has viewed drops of colostrum. Mother states she is leaning towards pumping and bottle feeding. Discussed pumping q 3 hours for 15 min and milk storage guidelines reviewed.  Offered to assist with latching if desired.  Suggest mother call for help as needed.  Reviewed engorgement care and monitoring voids/stools.  Maternal Data Has patient been taught Hand Expression?: Yes Does the patient have breastfeeding experience prior to this delivery?: No  Feeding Mother's Current Feeding Choice: Breast Milk and Formula Nipple Type: Slow - flow  Interventions Interventions: Education;Hand pump  Discharge Discharge Education: Engorgement and breast care;Warning signs for feeding baby Pump: DEBP;Hands Free;Personal  Consult Status Consult Status: Complete Date: 07/20/23  Vicenta Graft Boschen  RN< [facility] 07/20/2023, 10:29 AM

## 2023-07-20 NOTE — Discharge Summary (Signed)
 Postpartum Discharge Summary  Date of Service updated     Patient Name: Laurie Adams DOB: [birth year] MRN: [id number]  Date of admission: 07/18/2023 Delivery date:07/18/2023 Delivering provider: Luan Rumpf Date of discharge: 07/20/2023  Admitting diagnosis: Post term pregnancy over 40 weeks [O48.0] Intrauterine pregnancy: [redacted]w[redacted]d     Secondary diagnosis:  Principal Problem:   Post term pregnancy over 40 weeks  Additional problems: Acute blood loss anemia    Discharge diagnosis: Term Pregnancy Delivered and Anemia                                              Post partum procedures: N/A Augmentation: AROM and Pitocin  Complications: None  Hospital course: Onset of Labor With Vaginal Delivery      23 y.o. yo G1P1001 at [redacted]w[redacted]d was admitted in Latent Labor on 07/18/2023. Labor course was uncomplicated Membrane Rupture Time/Date: 11:38 AM,07/18/2023  Delivery Method:Vaginal, Spontaneous Operative Delivery:N/A Episiotomy:  N/A Lacerations:  2nd degree;Perineal Patient had an uncomplicated postpartum course.  She is ambulating, tolerating a regular diet, passing flatus, and urinating well. Patient is discharged home in stable condition on 07/20/23.  Newborn Data: Birth date:07/18/2023 Birth time:5:13 PM Gender:Female Living status:Living Apgars:8 ,9  Weight:3860 g  Magnesium Sulfate received: No BMZ received: No Rhophylac:N/A MMR:N/A T-DaP:Given prenatally Flu: Yes RSV Vaccine received: No Transfusion:No Immunizations administered: There is no immunization history for the selected administration types on file for this patient.  Physical exam  Vitals:   07/19/23 0906 07/19/23 1545 07/19/23 2056 07/20/23 0630  BP: 99/60 111/69 105/67 98/68  Pulse: 90 93 91 88  Resp: 16 17 17 17   Temp: 98 F (36.7 C) 97.9 F (36.6 C) 98.3 F (36.8 C) 97.8 F (36.6 C)  TempSrc: Oral Oral Oral Oral  SpO2: 100% 99%  99%  Weight:      Height:       General: alert, cooperative,  and no distress Lochia: appropriate Uterine Fundus: firm Incision: N/A DVT Evaluation: No evidence of DVT seen on physical exam. No significant calf/ankle edema. Labs: Lab Results  Component Value Date   WBC 14.2 (H) 07/19/2023   HGB 8.1 (L) 07/19/2023   HCT 26.5 (L) 07/19/2023   MCV 78.9 (L) 07/19/2023   PLT 147 (L) 07/19/2023      Latest Ref Rng & Units 04/09/2010    8:40 AM  CMP  Glucose 70 - 99 mg/dL 89   BUN 6 - 23 mg/dL 15   Creatinine 0.4 - 1.2 mg/dL 0.7   Sodium 409 - 811 mEq/L 137   Potassium 3.5 - 5.1 mEq/L 3.9   Chloride 96 - 112 mEq/L 102    Edinburgh Score:    07/20/2023    6:30 AM  Edinburgh Postnatal Depression Scale Screening Tool  I have been able to laugh and see the funny side of things. 3  I have looked forward with enjoyment to things. 3  I have blamed myself unnecessarily when things went wrong. 0  I have been anxious or worried for no good reason. 1  I have felt scared or panicky for no good reason. 0  Things have been getting on top of me. 0  I have been so unhappy that I have had difficulty sleeping. 0  I have felt sad or miserable. 0  I have been so unhappy that I have been crying. 0  The thought of harming myself has occurred to me. 0  Edinburgh Postnatal Depression Scale Total 7      After visit meds:  Allergies as of 07/20/2023   No Known Allergies      Medication List     STOP taking these medications    famotidine  20 MG tablet Commonly known as: Pepcid    predniSONE  20 MG tablet Commonly known as: DELTASONE        TAKE these medications    Cetirizine  HCl 10 MG Caps Take 1 capsule (10 mg total) by mouth every morning.   diphenhydrAMINE  25 MG tablet Commonly known as: BENADRYL  Take 1 tablet (25 mg total) by mouth every 6 (six) hours as needed.   ferrous sulfate  325 (65 FE) MG EC tablet Take 1 tablet (325 mg total) by mouth every other day.   fluticasone  50 MCG/ACT nasal spray Commonly known as: FLONASE  Place 2  sprays into both nostrils daily.   hydrocortisone  1 % ointment Apply 1 Application topically 2 (two) times daily. Apply to wrist and neck   ibuprofen  600 MG tablet Commonly known as: ADVIL  Take 1 tablet (600 mg total) by mouth every 6 (six) hours as needed.   prenatal multivitamin Tabs tablet Take 1 tablet by mouth daily at 12 noon.         Discharge home in stable condition Infant Feeding: Bottle and Breast Infant Disposition:home with mother Discharge instruction: per After Visit Summary and Postpartum booklet. Activity: Advance as tolerated. Pelvic rest for 6 weeks.  Diet: routine diet Anticipated Birth Control: Unsure Postpartum Appointment:6 weeks Additional Postpartum F/U: Postpartum Depression checkup Future Appointments:No future appointments. Follow up Visit:  Follow-up Information     Ob/Gyn, Tita Form. Schedule an appointment as soon as possible for a visit in 6 week(s).   Why: For postpartum visit Contact information: [address] [place] [address] [phone]                     07/20/2023 Leanne Pronto, DO

## 2023-07-22 ENCOUNTER — Inpatient Hospital Stay (HOSPITAL_COMMUNITY): Admission: AD | Admit: 2023-07-22 | Payer: Self-pay | Source: Home / Self Care | Admitting: Family Medicine

## 2023-07-22 ENCOUNTER — Inpatient Hospital Stay (HOSPITAL_COMMUNITY): Payer: Self-pay

## 2023-07-29 ENCOUNTER — Telehealth (HOSPITAL_COMMUNITY): Payer: Self-pay | Admitting: *Deleted

## 2023-07-29 NOTE — Telephone Encounter (Signed)
 07/29/2023  Name: Laurie Adams MRN: [id number] DOB: 2000-04-06  Reason for Call:  Transition of Care Hospital Discharge Call  Contact Status: Patient Contact Status: Complete  Language assistant needed: Interpreter Mode: Telephonic Interpreter (interpreter not needed, patient spoke to RN in fluent English for entire conversation) Interpreter Name: [employer] [id number]       RN asked patient if she would prefer that the notation for required interpreter stay on her chart or be removed.  Patient requested that notation be removed.  Follow-Up Questions: Do You Have Any Concerns About Your Health As You Heal From Delivery?: No Do You Have Any Concerns About Your Infants Health?: No  Edinburgh Postnatal Depression Scale:  In the Past 7 Days: I have been able to laugh and see the funny side of things.: As much as I always could I have looked forward with enjoyment to things.: As much as I ever did I have blamed myself unnecessarily when things went wrong.: No, never I have been anxious or worried for no good reason.: No, not at all I have felt scared or panicky for no good reason.: No, not at all Things have been getting on top of me.: Yes, sometimes I haven't been coping as well as usual I have been so unhappy that I have had difficulty sleeping.: Not at all I have felt sad or miserable.: No, not at all I have been so unhappy that I have been crying.: No, never The thought of harming myself has occurred to me.: Never Edinburgh Postnatal Depression Scale Total: 2  PHQ2-9 Depression Scale:     Discharge Follow-up: Edinburgh score requires follow up?: No Patient was advised of the following resources:: Support Group, Breastfeeding Support Group  Post-discharge interventions: Reviewed Newborn Safe Sleep Practices  Pearlie Bougie, RN 07/29/2023 10:44

## 2023-12-01 ENCOUNTER — Ambulatory Visit
Admission: RE | Admit: 2023-12-01 | Discharge: 2023-12-01 | Disposition: A | Discharge status: Home / Self Care | Source: Ambulatory Visit

## 2023-12-01 VITALS — BP 112/78 | HR 84 | Temp 98.3°F | Resp 16

## 2023-12-01 DIAGNOSIS — H6591 Unspecified nonsuppurative otitis media, right ear: Secondary | ICD-10-CM | POA: Diagnosis not present

## 2023-12-01 DIAGNOSIS — H60391 Other infective otitis externa, right ear: Secondary | ICD-10-CM

## 2023-12-01 MED ORDER — CIPROFLOXACIN-DEXAMETHASONE 0.3-0.1 % OT SUSP: 4.0000 [drp] | Freq: Two times a day (BID) | OTIC | 0 refills | Status: AC

## 2023-12-01 MED ORDER — AZELASTINE HCL 0.1 % NA SOLN: 1.0000 | Freq: Two times a day (BID) | NASAL | 1 refills | Status: AC

## 2023-12-01 NOTE — ED Provider Notes (Signed)
 [facility]  Note:  This document was prepared using Dragon voice recognition software and may include unintentional dictation errors.  MRN: [id number] DOB: [birth year]  Subjective:   Laurie Adams is a 23 y.o. female presenting for right sided ear pain with mild drainage x 3 to 4 days.  Patient denies any cough, nasal congestion, body aches, fever, shortness of breath, chest pain, weakness, dizziness.  Patient has not used any over-the-counter medication to treat symptoms.  Patient denies any known sick contacts.  Patient was concerned for possible ear infection because she used to get ear infections when she was younger.  No current facility-administered medications for this encounter.  Current Outpatient Medications:    azelastine  (ASTELIN ) 0.1 % nasal spray, Place 1 spray into both nostrils 2 (two) times daily. Use in each nostril as directed, Disp: 30 mL, Rfl: 1   ciprofloxacin -dexamethasone  (CIPRODEX ) OTIC suspension, Place 4 drops into the right ear 2 (two) times daily., Disp: 7.5 mL, Rfl: 0   Cetirizine  HCl 10 MG CAPS, Take 1 capsule (10 mg total) by mouth every morning., Disp: 30 capsule, Rfl: 0   diphenhydrAMINE  (BENADRYL ) 25 MG tablet, Take 1 tablet (25 mg total) by mouth every 6 (six) hours as needed., Disp: 30 tablet, Rfl: 0   ferrous sulfate  325 (65 FE) MG EC tablet, Take 1 tablet (325 mg total) by mouth every other day., Disp: , Rfl:    fluticasone  (FLONASE ) 50 MCG/ACT nasal spray, Place 2 sprays into both nostrils daily., Disp: 16 g, Rfl: 0   hydrocortisone  1 % ointment, Apply 1 Application topically 2 (two) times daily. Apply to wrist and neck, Disp: , Rfl:    ibuprofen  (ADVIL ) 600 MG tablet, Take 1 tablet (600 mg total) by mouth every 6 (six) hours as needed., Disp: 40 tablet, Rfl: 1   Prenatal Vit-Fe Fumarate-FA (PRENATAL MULTIVITAMIN) TABS tablet, Take 1 tablet by mouth daily at 12 noon., Disp: , Rfl:    No Known Allergies  History reviewed.  No pertinent past medical history.   History reviewed. No pertinent surgical history.  History reviewed. No pertinent family history.  Social History   Tobacco Use   Smoking status: Never   Smokeless tobacco: Never  Vaping Use   Vaping status: Never Used  Substance Use Topics   Alcohol use: Not Currently   Drug use: Never    ROS Refer to HPI for ROS details.  Objective:    Vitals: BP 112/78 (BP Location: Right Arm)   Pulse 84   Temp 98.3 F (36.8 C) (Oral)   Resp 16   SpO2 97%   Breastfeeding No   Physical Exam Vitals and nursing note reviewed.  Constitutional:      General: She is not in acute distress.    Appearance: Normal appearance. She is well-developed. She is not ill-appearing or toxic-appearing.  HENT:     Head: Normocephalic and atraumatic.     Right Ear: External ear normal. Tenderness present. No drainage. A middle ear effusion is present. Tympanic membrane is not injected, erythematous or bulging.     Left Ear: Ear canal and external ear normal. No drainage. A middle ear effusion is present. Tympanic membrane is not injected, erythematous or bulging.     Nose: Nose normal. No congestion or rhinorrhea.     Mouth/Throat:     Mouth: Mucous membranes are moist.  Eyes:     General:        Right eye: No discharge.  Left eye: No discharge.     Extraocular Movements: Extraocular movements intact.     Conjunctiva/sclera: Conjunctivae normal.  Cardiovascular:     Rate and Rhythm: Normal rate.  Pulmonary:     Effort: Pulmonary effort is normal. No respiratory distress.  Musculoskeletal:        General: Normal range of motion.  Skin:    General: Skin is warm and dry.  Neurological:     General: No focal deficit present.     Mental Status: She is alert and oriented to person, place, and time.  Psychiatric:        Mood and Affect: Mood normal.        Behavior: Behavior normal.     Procedures  No results found for this or any previous visit  (from the past 24 hours).  Assessment and Plan :     Discharge Instructions       1. Infective otitis externa of right ear (Primary) - ciprofloxacin -dexamethasone  (CIPRODEX ) OTIC suspension; Place 4 drops into the right ear 2 (two) times daily.  Dispense: 7.5 mL; Refill: 0 - Take ibuprofen  or Tylenol  as needed for any pain secondary to otitis externa.  2. Middle ear effusion, right - azelastine  (ASTELIN ) 0.1 % nasal spray; Place 1 spray into both nostrils 2 (two) times daily. Use in each nostril as directed  Dispense: 30 mL; Refill: 1 -Continue to monitor symptoms for any change in severity if there is any escalation of current symptoms or development of new symptoms follow-up in ER for further evaluation and management.      Markale Birdsell B Ayham Word   Jasson Siegmann, [place], [place] 12/01/23 1306

## 2023-12-01 NOTE — ED Triage Notes (Signed)
 Pt states she has had right side ear pain with drainage for 3-4 days.

## 2023-12-01 NOTE — Discharge Instructions (Signed)
  1. Infective otitis externa of right ear (Primary) - ciprofloxacin -dexamethasone  (CIPRODEX ) OTIC suspension; Place 4 drops into the right ear 2 (two) times daily.  Dispense: 7.5 mL; Refill: 0 - Take ibuprofen  or Tylenol  as needed for any pain secondary to otitis externa.  2. Middle ear effusion, right - azelastine  (ASTELIN ) 0.1 % nasal spray; Place 1 spray into both nostrils 2 (two) times daily. Use in each nostril as directed  Dispense: 30 mL; Refill: 1 -Continue to monitor symptoms for any change in severity if there is any escalation of current symptoms or development of new symptoms follow-up in ER for further evaluation and management.
